# Patient Record
Sex: Male | Born: 1995 | Race: Black or African American | Hispanic: No | Marital: Single | Smoking: Never smoker
Health system: Southern US, Community
[De-identification: ages and names within clinical notes are randomized; demographics above are authoritative.]

---

## 2017-08-17 ENCOUNTER — Ambulatory Visit (HOSPITAL_COMMUNITY)
Admission: EM | Admit: 2017-08-17 | Discharge: 2017-08-17 | Disposition: A | Discharge status: Home / Self Care | Payer: PRIVATE HEALTH INSURANCE | Attending: Family Medicine | Admitting: Family Medicine

## 2017-08-17 ENCOUNTER — Encounter (HOSPITAL_COMMUNITY): Payer: Self-pay | Admitting: Emergency Medicine

## 2017-08-17 ENCOUNTER — Ambulatory Visit (INDEPENDENT_AMBULATORY_CARE_PROVIDER_SITE_OTHER): Payer: PRIVATE HEALTH INSURANCE

## 2017-08-17 DIAGNOSIS — S93402A Sprain of unspecified ligament of left ankle, initial encounter: Secondary | ICD-10-CM | POA: Diagnosis not present

## 2017-08-17 MED ORDER — IBUPROFEN 800 MG PO TABS: 800.0000 mg | ORAL_TABLET | Freq: Three times a day (TID) | ORAL | 0 refills | Status: AC

## 2017-08-17 NOTE — ED Triage Notes (Signed)
Pt here for left ankle pain after rolling yesterday playing basketball

## 2017-08-17 NOTE — Discharge Instructions (Signed)
No fracture seen on x-ray, likely ankle sprain Use anti-inflammatories for pain/swelling. You may take up to 800 mg Ibuprofen every 8 hours with food. You may supplement Ibuprofen with Tylenol [phone] mg every 8 hours.  Ice and elevate leg when resting at home multiple times a day Please use ankle brace over the next 2 weeks to support the ankle as it is healing  Return if symptoms worsening or not getting better

## 2017-08-17 NOTE — ED Notes (Signed)
Pt weight baring on crutches when leaving. Pt demonstrated and acknowledge understanding of crutch usage.

## 2017-08-18 NOTE — ED Provider Notes (Signed)
[facility]    CSN: [id number] Arrival date & time: 08/17/17  1354     History   Chief Complaint Chief Complaint  Patient presents with  . Ankle Pain    HPI Steven Mercado is a 22 y.o. male no significant past medical history presenting today for evaluation of left ankle pain.  Patient states that yesterday he was playing basketball and rolled his ankle.  He rolled his ankle inward.  Since he has had swelling, pain with weightbearing.  He has been wearing an ankle brace to help support his ankle.  He has taken some ibuprofen for the pain.  Denies numbness or tingling.  During x-ray he did note some numbness sensation with one position.  HPI  History reviewed. No pertinent past medical history.  There are no active problems to display for this patient.   History reviewed. No pertinent surgical history.     Home Medications    Prior to Admission medications   Medication Sig Start Date End Date Taking? Authorizing Provider  ibuprofen (ADVIL,MOTRIN) 800 MG tablet Take 1 tablet (800 mg total) by mouth 3 (three) times daily. 08/17/17   Laryssa Hassing, Junius Creamer, PA-C    Family History History reviewed. No pertinent family history.  Social History Social History   Tobacco Use  . Smoking status: Never Smoker  . Smokeless tobacco: Never Used  Substance Use Topics  . Alcohol use: Yes  . Drug use: Never     Allergies   Patient has no known allergies.   Review of Systems Review of Systems  Constitutional: Negative for activity change, chills, diaphoresis and fatigue.  Eyes: Negative for photophobia and visual disturbance.  Respiratory: Negative for chest tightness and shortness of breath.   Cardiovascular: Negative for chest pain and leg swelling.  Gastrointestinal: Negative for abdominal pain, nausea and vomiting.  Musculoskeletal: Positive for arthralgias, gait problem and myalgias. Negative for back pain, neck pain and neck stiffness.  Skin: Negative for color  change and wound.  Neurological: Negative for dizziness, weakness, light-headedness, numbness and headaches.     Physical Exam Triage Vital Signs ED Triage Vitals [08/17/17 1425]  Enc Vitals Group     BP (!) 144/54     Pulse Rate 62     Resp 18     Temp 98.1 F (36.7 C)     Temp Source Oral     SpO2 100 %     Weight      Height      Head Circumference      Peak Flow      Pain Score      Pain Loc      Pain Edu?      Excl. in GC?    No data found.  Updated Vital Signs BP (!) 144/54 (BP Location: Left Arm)   Pulse 62   Temp 98.1 F (36.7 C) (Oral)   Resp 18   SpO2 100%   Visual Acuity Right Eye Distance:   Left Eye Distance:   Bilateral Distance:    Right Eye Near:   Left Eye Near:    Bilateral Near:     Physical Exam  Constitutional: He is oriented to person, place, and time. He appears well-developed and well-nourished.  No acute distress  HENT:  Head: Normocephalic and atraumatic.  Nose: Nose normal.  Eyes: Conjunctivae are normal.  Neck: Neck supple.  Cardiovascular: Normal rate.  Pulmonary/Chest: Effort normal. No respiratory distress.  Abdominal: He exhibits no distension.  Musculoskeletal: Normal range of motion.  Minimal swelling to left foot and ankle, mild tenderness to palpation overlying lateral malleolus as well as proximal dorsum of foot, nontender to palpation over distal metatarsals as well as toes, able to wiggle toes, dorsalis pedis 2+, cap refill less than 2 seconds.  Neurological: He is alert and oriented to person, place, and time.  Skin: Skin is warm and dry.  Psychiatric: He has a normal mood and affect.  Nursing note and vitals reviewed.    UC Treatments / Results  Labs (all labs ordered are listed, but only abnormal results are displayed) Labs Reviewed - No data to display  EKG None  Radiology Dg Ankle Complete Left  Result Date: 08/17/2017 CLINICAL DATA:  Rolled LEFT ankle last night, lateral pain EXAM: LEFT ANKLE  COMPLETE - 3+ VIEW COMPARISON:  None FINDINGS: Osseous mineralization normal. Ankle joint space preserved. Corticated old ossicle at lateral joint line. No acute fracture, dislocation, or bone destruction. IMPRESSION: No acute osseous abnormalities. Electronically Signed   By: Ulyses SouthwardMark  Boles M.D.   On: 08/17/2017 14:46    Procedures Procedures (including critical care time)  Medications Ordered in UC Medications - No data to display  Initial Impression / Assessment and Plan / UC Course  I have reviewed the triage vital signs and the nursing notes.  Pertinent labs & imaging results that were available during my care of the patient were reviewed by me and considered in my medical decision making (see chart for details).     X-ray negative for fracture, likely ankle sprain.  Will have continue to wear ankle brace.  Weight-bear as tolerated.  Anti-inflammatories, rest, ice, elevation.  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  Follow-up if symptoms not improving as expected over the next 2 weeks.Discussed strict return precautions. Patient verbalized understanding and is agreeable with plan.  Final Clinical Impressions(s) / UC Diagnoses   Final diagnoses:  Sprain of left ankle, unspecified ligament, initial encounter     Discharge Instructions     No fracture seen on x-ray, likely ankle sprain Use anti-inflammatories for pain/swelling. You may take up to 800 mg Ibuprofen every 8 hours with food. You may supplement Ibuprofen with Tylenol [phone] mg every 8 hours.  Ice and elevate leg when resting at home multiple times a day Please use ankle brace over the next 2 weeks to support the ankle as it is healing  Return if symptoms worsening or not getting better    ED Prescriptions    Medication Sig Dispense Auth. Provider   ibuprofen (ADVIL,MOTRIN) 800 MG tablet Take 1 tablet (800 mg total) by mouth 3 (three) times daily. 21 tablet  Lerline Valdivia, MatherHallie C, PA-C     Controlled Substance Prescriptions [place] Controlled Substance Registry consulted? Not Applicable   Lew DawesWieters, Markee Remlinger C, [place]PA-C 08/18/17 1352

## 2019-09-05 IMAGING — DX DG ANKLE COMPLETE 3+V*L*
3 series · 3 of 3 positions shown · non-contrast
Comparison: None

CLINICAL DATA: Rolled LEFT ankle last night, lateral pain

EXAM:
LEFT ANKLE COMPLETE - 3+ VIEW

[ankle ap]
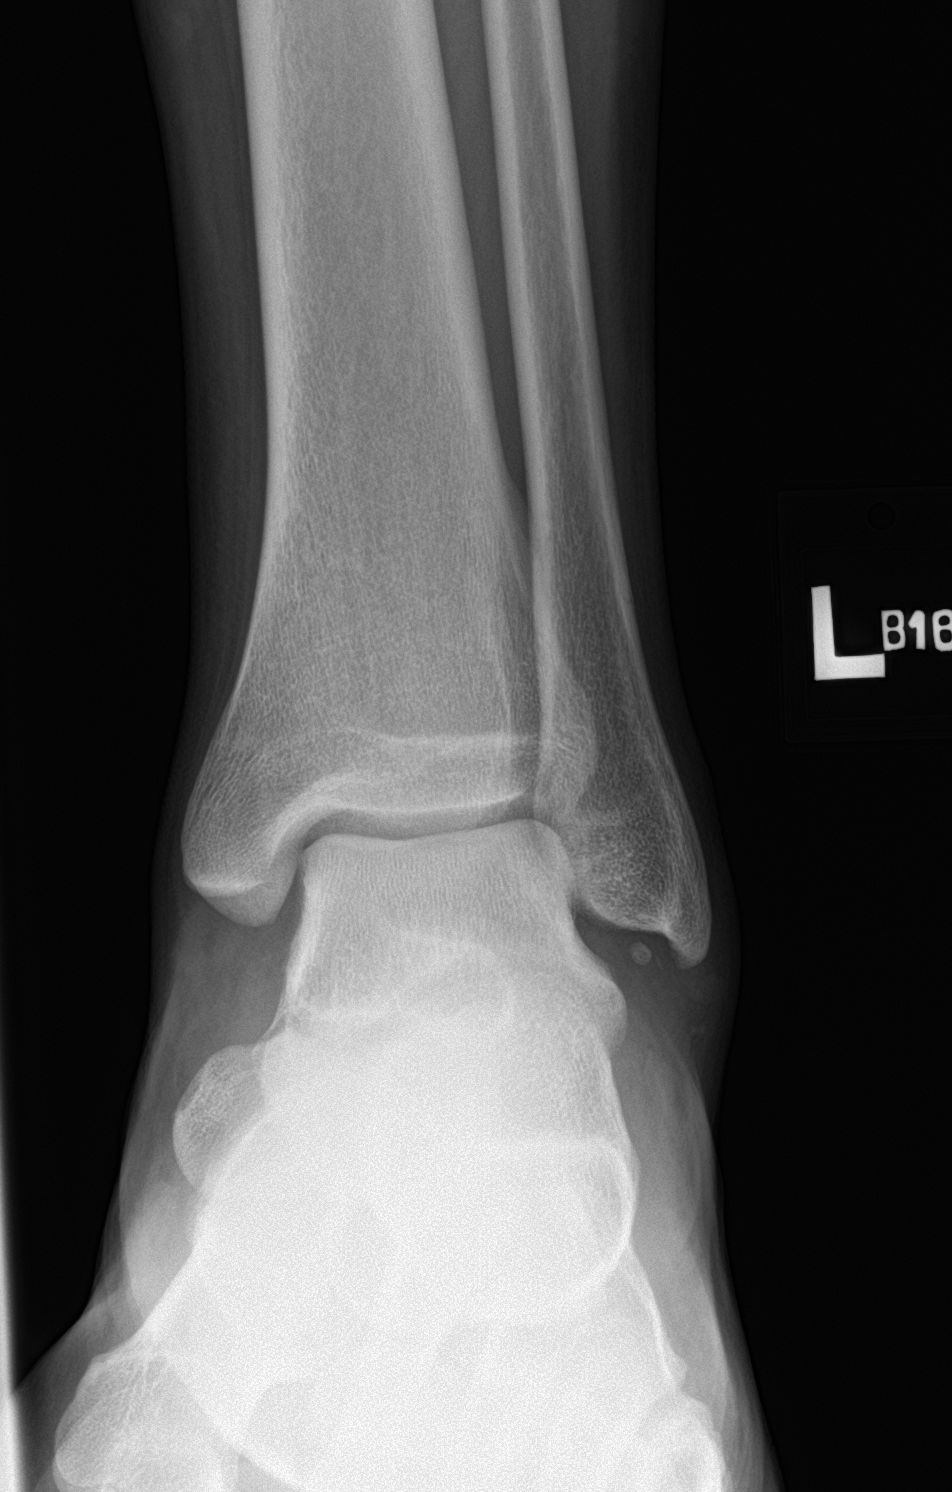

[ankle obl]
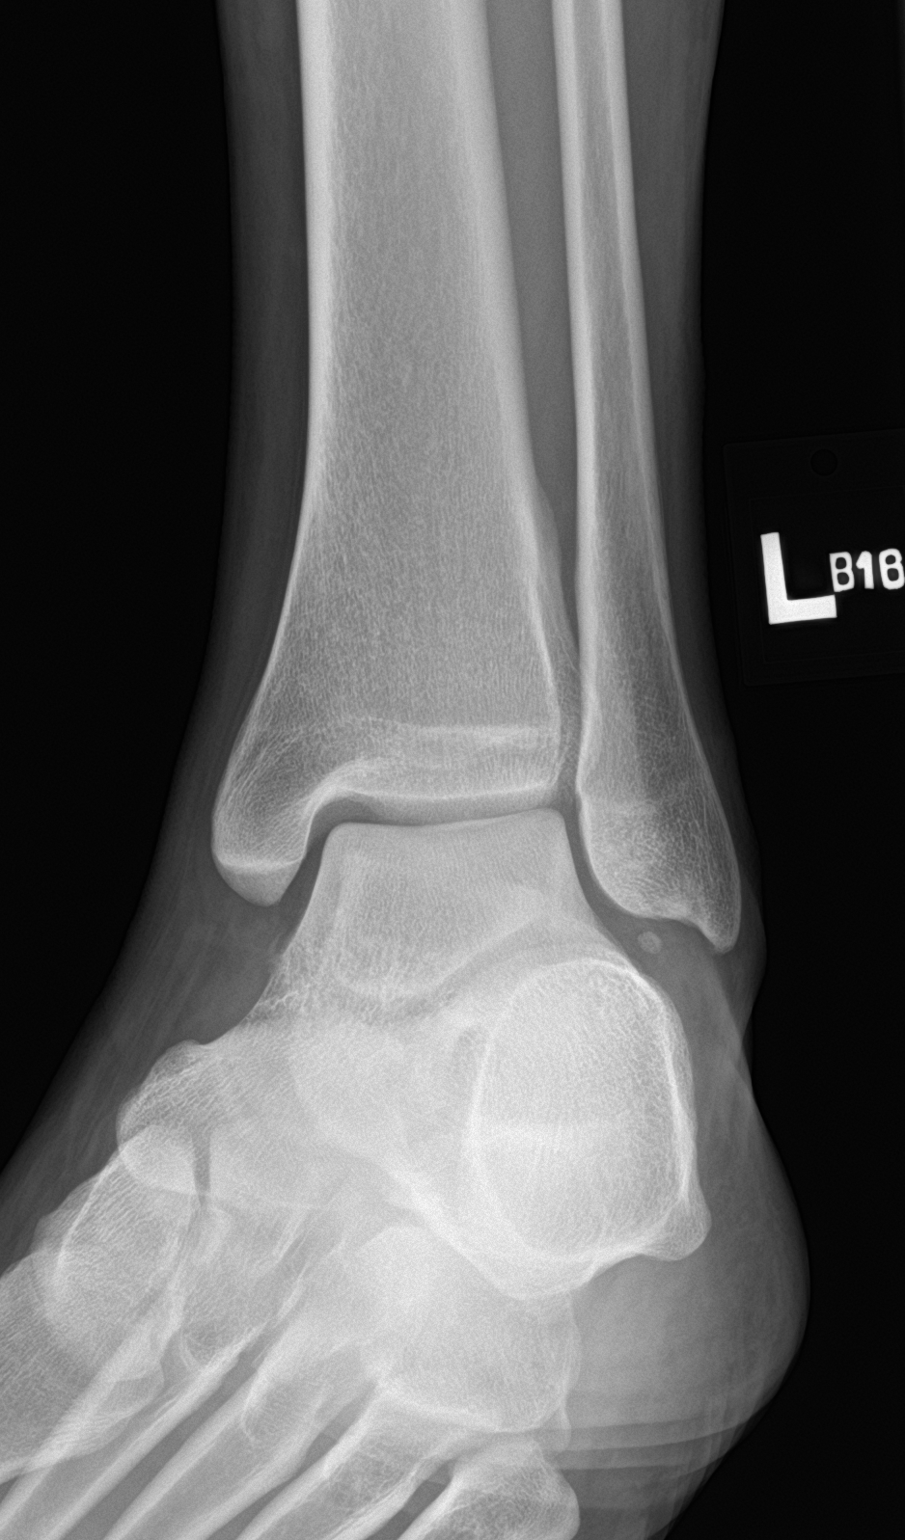

[ankle lat]
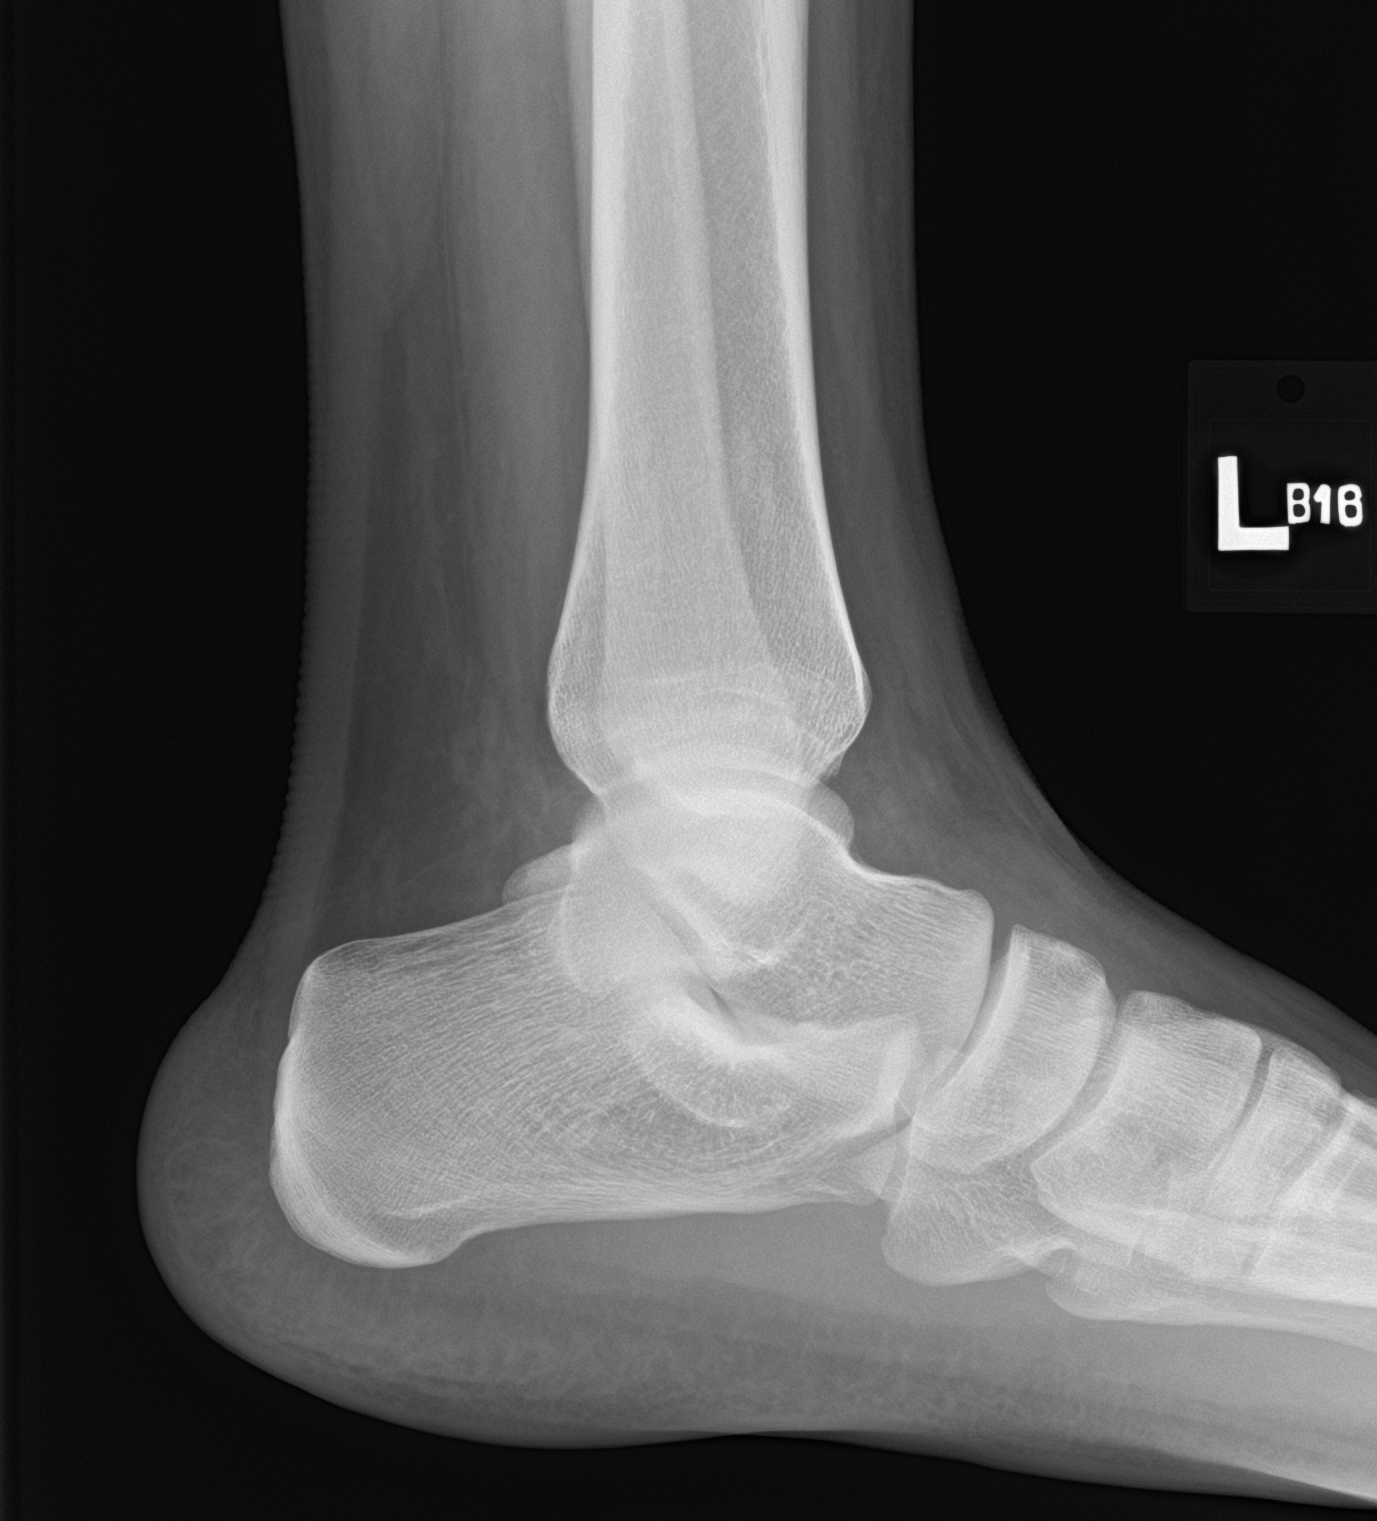

[3 of 3 positions shown; findings below may reference images not displayed]

FINDINGS: Osseous mineralization normal.

Ankle joint space preserved.

Corticated old ossicle at lateral joint line.

No acute fracture, dislocation, or bone destruction.
IMPRESSION: No acute osseous abnormalities.
